# Patient Record
Sex: Male | Born: 1975 | Race: Asian | Hispanic: No | Marital: Single | Smoking: Current every day smoker
Health system: Southern US, Community
[De-identification: ages and names within clinical notes are randomized; demographics above are authoritative.]

## PROBLEM LIST (undated history)

## (undated) DIAGNOSIS — S92309A Fracture of unspecified metatarsal bone(s), unspecified foot, initial encounter for closed fracture: Secondary | ICD-10-CM

## (undated) HISTORY — PX: NO PAST SURGERIES: SHX2092

---

## 2012-12-12 ENCOUNTER — Emergency Department (HOSPITAL_COMMUNITY)
Admission: EM | Admit: 2012-12-12 | Discharge: 2012-12-12 | Disposition: A | Discharge status: Home / Self Care | Payer: Self-pay | Attending: Emergency Medicine | Admitting: Emergency Medicine

## 2012-12-12 ENCOUNTER — Encounter (HOSPITAL_COMMUNITY): Payer: Self-pay | Admitting: Emergency Medicine

## 2012-12-12 DIAGNOSIS — H6120 Impacted cerumen, unspecified ear: Secondary | ICD-10-CM | POA: Insufficient documentation

## 2012-12-12 DIAGNOSIS — R0982 Postnasal drip: Secondary | ICD-10-CM | POA: Insufficient documentation

## 2012-12-12 DIAGNOSIS — H9209 Otalgia, unspecified ear: Secondary | ICD-10-CM | POA: Insufficient documentation

## 2012-12-12 DIAGNOSIS — R05 Cough: Secondary | ICD-10-CM | POA: Insufficient documentation

## 2012-12-12 DIAGNOSIS — R059 Cough, unspecified: Secondary | ICD-10-CM | POA: Insufficient documentation

## 2012-12-12 DIAGNOSIS — H9203 Otalgia, bilateral: Secondary | ICD-10-CM

## 2012-12-12 DIAGNOSIS — J069 Acute upper respiratory infection, unspecified: Secondary | ICD-10-CM | POA: Insufficient documentation

## 2012-12-12 DIAGNOSIS — J029 Acute pharyngitis, unspecified: Secondary | ICD-10-CM | POA: Insufficient documentation

## 2012-12-12 DIAGNOSIS — F17200 Nicotine dependence, unspecified, uncomplicated: Secondary | ICD-10-CM | POA: Insufficient documentation

## 2012-12-12 DIAGNOSIS — J3489 Other specified disorders of nose and nasal sinuses: Secondary | ICD-10-CM | POA: Insufficient documentation

## 2012-12-12 MED ORDER — IBUPROFEN 600 MG PO TABS: 600.0000 mg | ORAL_TABLET | Freq: Four times a day (QID) | ORAL | Status: DC | PRN

## 2012-12-12 MED ORDER — IBUPROFEN 800 MG PO TABS
800.0000 mg | ORAL_TABLET | Freq: Once | ORAL | Status: AC
  Administered 2012-12-12: 800 mg via ORAL
  Filled 2012-12-12: qty 1

## 2012-12-12 MED ORDER — AZITHROMYCIN 250 MG PO TABS: ORAL_TABLET | ORAL | Status: DC

## 2012-12-12 NOTE — ED Notes (Signed)
Per patient, woke up yesterday with left ear pain-got worse today

## 2012-12-12 NOTE — ED Provider Notes (Signed)
History     CSN: [id number]  Arrival date & time 12/12/12  1153   First MD Initiated Contact with Patient 12/12/12 1348      Chief Complaint  Patient presents with  . Otalgia    (Consider location/radiation/quality/duration/timing/severity/associated sxs/prior treatment) Patient is a 37 y.o. male presenting with ear pain. The history is provided by the patient and a parent. No language interpreter was used.  Otalgia Location:  Bilateral (L >R) Severity:  Moderate Onset quality:  Gradual Duration:  2 days Timing:  Constant Progression:  Unchanged Chronicity:  New Worsened by:  Coughing Associated symptoms: congestion, cough, rhinorrhea and sore throat   Associated symptoms: no ear discharge, no fever, no headaches, no neck pain and no vomiting   37 yo with c/o bilateral ear pain x 2 days with upper respiratory symptoms.  Denies fever, nausea and vomiting.  No past medical history. Smoker.  History reviewed. No pertinent past medical history.  History reviewed. No pertinent past surgical history.  No family history on file.  History  Substance Use Topics  . Smoking status: Current Every Day Smoker  . Smokeless tobacco: Not on file  . Alcohol Use: Yes      Review of Systems  Constitutional: Negative.  Negative for fever.  HENT: Positive for ear pain, congestion, sore throat, rhinorrhea and postnasal drip. Negative for neck pain and ear discharge.   Eyes: Negative.   Respiratory: Positive for cough.   Cardiovascular: Negative.   Gastrointestinal: Negative.  Negative for vomiting.  Neurological: Negative.  Negative for headaches.  Psychiatric/Behavioral: Negative.   All other systems reviewed and are negative.    Allergies  Review of patient's allergies indicates no known allergies.  Home Medications  No current outpatient prescriptions on file.  BP 154/103  Pulse 98  Temp(Src) 98.4 F (36.9 C) (Oral)  Resp 18  SpO2 100%  Physical Exam  Nursing note  and vitals reviewed. Constitutional: He is oriented to person, place, and time. He appears well-developed and well-nourished.  HENT:  Head: Normocephalic.  Eyes: Conjunctivae and EOM are normal. Pupils are equal, round, and reactive to light.  Neck: Normal range of motion. Neck supple.  Cardiovascular: Normal rate.   Pulmonary/Chest: Effort normal and breath sounds normal. No respiratory distress.  Abdominal: Soft. He exhibits no distension. There is no tenderness.  Musculoskeletal: Normal range of motion.  Neurological: He is alert and oriented to person, place, and time.  Skin: Skin is warm and dry.  Psychiatric: He has a normal mood and affect.    ED Course  Procedures (including critical care time)  Labs Reviewed - No data to display No results found.   No diagnosis found.    MDM  Upper respiratory infection x 1 week with bilateral ear pain and cerumen impaction.  Rx for z-pack.  Hypertensive in the ER.  He will keep a log book and follow up with pcp from list.  No stroke symptoms. Advised to use products to loosen cerumen.        Remi Haggard, NP 12/13/12 1104

## 2012-12-15 NOTE — ED Provider Notes (Signed)
Medical screening examination/treatment/procedure(s) were performed by non-physician practitioner and as supervising physician I was immediately available for consultation/collaboration.  Devaney Segers T Indyah Saulnier, MD 12/15/12 1525 

## 2013-02-16 ENCOUNTER — Encounter (HOSPITAL_COMMUNITY): Payer: Self-pay

## 2013-02-16 ENCOUNTER — Emergency Department (HOSPITAL_COMMUNITY)
Admission: EM | Admit: 2013-02-16 | Discharge: 2013-02-16 | Disposition: A | Discharge status: Home / Self Care | Payer: Self-pay | Attending: Emergency Medicine | Admitting: Emergency Medicine

## 2013-02-16 ENCOUNTER — Emergency Department (HOSPITAL_COMMUNITY): Payer: Self-pay

## 2013-02-16 DIAGNOSIS — H6120 Impacted cerumen, unspecified ear: Secondary | ICD-10-CM | POA: Insufficient documentation

## 2013-02-16 DIAGNOSIS — S0280XA Fracture of other specified skull and facial bones, unspecified side, initial encounter for closed fracture: Secondary | ICD-10-CM | POA: Insufficient documentation

## 2013-02-16 DIAGNOSIS — S0291XA Unspecified fracture of skull, initial encounter for closed fracture: Secondary | ICD-10-CM

## 2013-02-16 DIAGNOSIS — F1010 Alcohol abuse, uncomplicated: Secondary | ICD-10-CM | POA: Insufficient documentation

## 2013-02-16 LAB — URINALYSIS, ROUTINE W REFLEX MICROSCOPIC
Ketones, ur: NEGATIVE mg/dL
Leukocytes, UA: NEGATIVE
Nitrite: NEGATIVE
Protein, ur: NEGATIVE mg/dL
pH: 6 (ref 5.0–8.0)

## 2013-02-16 LAB — COMPREHENSIVE METABOLIC PANEL
Alkaline Phosphatase: 112 U/L (ref 39–117)
BUN: 11 mg/dL (ref 6–23)
GFR calc Af Amer: >90 mL/min (ref >90)
Glucose, Bld: 100 mg/dL — ABNORMAL HIGH (ref 70–99)
Potassium: 3.5 mEq/L (ref 3.5–5.1)
Total Bilirubin: 0.4 mg/dL (ref 0.3–1.2)
Total Protein: 7.7 g/dL (ref 6.0–8.3)

## 2013-02-16 LAB — CBC WITH DIFFERENTIAL/PLATELET
Eosinophils Absolute: 0.7 10*3/uL (ref 0.0–0.7)
Eosinophils Relative: 6 % — ABNORMAL HIGH (ref 0–5)
Lymphs Abs: 4.1 10*3/uL — ABNORMAL HIGH (ref 0.7–4.0)
MCH: 33.7 pg (ref 26.0–34.0)
MCHC: 37.6 g/dL — ABNORMAL HIGH (ref 30.0–36.0)
MCV: 89.8 fL (ref 78.0–100.0)
Monocytes Absolute: 0.6 10*3/uL (ref 0.1–1.0)
Platelets: 257 10*3/uL (ref 150–400)
RBC: 5.37 MIL/uL (ref 4.22–5.81)
RDW: 13.1 % (ref 11.5–15.5)

## 2013-02-16 MED ORDER — OXYCODONE-ACETAMINOPHEN 5-325 MG PO TABS: 1.0000 | ORAL_TABLET | Freq: Once | ORAL | Status: DC

## 2013-02-16 MED ORDER — OXYCODONE-ACETAMINOPHEN 5-325 MG PO TABS: 1.0000 | ORAL_TABLET | ORAL | Status: DC | PRN

## 2013-02-16 MED ORDER — SODIUM CHLORIDE 0.9 % IV SOLN
1000.0000 mL | Freq: Once | INTRAVENOUS | Status: AC
  Administered 2013-02-16: 1000 mL via INTRAVENOUS

## 2013-02-16 MED ORDER — SODIUM CHLORIDE 0.9 % IV SOLN
1000.0000 mL | INTRAVENOUS | Status: DC
  Administered 2013-02-16: 1000 mL via INTRAVENOUS

## 2013-02-16 MED ORDER — ONDANSETRON 4 MG PO TBDP: 8.0000 mg | ORAL_TABLET | Freq: Once | ORAL | Status: DC

## 2013-02-16 MED ORDER — AMOXICILLIN-POT CLAVULANATE 875-125 MG PO TABS: 1.0000 | ORAL_TABLET | Freq: Two times a day (BID) | ORAL | Status: DC

## 2013-02-16 NOTE — ED Notes (Signed)
Returns from CT 
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
## 2013-02-16 NOTE — ED Notes (Signed)
Pt increasingly agitated in bed, attempting to remove C-collar - explained need to leave in place pending CT, pt sts he may remove collar

## 2013-02-16 NOTE — ED Notes (Signed)
Pt IV pulled out when changing positions. Dr. Patria Mane notified. No new orders- do not need to restart.

## 2013-02-16 NOTE — ED Notes (Signed)
Pt continues to attempt to remove C-collar, staff at bedside to explain need for collar and to prevent pt from removing
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
## 2013-02-16 NOTE — ED Notes (Signed)
Patient transported to CT 
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
## 2013-02-16 NOTE — ED Notes (Signed)
Pt sleeping. Chest visibly rising and falling.

## 2013-04-02 ENCOUNTER — Encounter (HOSPITAL_COMMUNITY): Payer: Self-pay | Admitting: Emergency Medicine

## 2015-10-06 ENCOUNTER — Emergency Department (HOSPITAL_COMMUNITY): Payer: Worker's Compensation

## 2015-10-06 ENCOUNTER — Emergency Department (HOSPITAL_COMMUNITY)
Admission: EM | Admit: 2015-10-06 | Discharge: 2015-10-06 | Disposition: A | Discharge status: Home / Self Care | Payer: Worker's Compensation | Attending: Emergency Medicine | Admitting: Emergency Medicine

## 2015-10-06 ENCOUNTER — Encounter (HOSPITAL_COMMUNITY): Payer: Self-pay | Admitting: Emergency Medicine

## 2015-10-06 DIAGNOSIS — S92902A Unspecified fracture of left foot, initial encounter for closed fracture: Secondary | ICD-10-CM | POA: Insufficient documentation

## 2015-10-06 DIAGNOSIS — Z79899 Other long term (current) drug therapy: Secondary | ICD-10-CM | POA: Insufficient documentation

## 2015-10-06 DIAGNOSIS — Y92099 Unspecified place in other non-institutional residence as the place of occurrence of the external cause: Secondary | ICD-10-CM | POA: Insufficient documentation

## 2015-10-06 DIAGNOSIS — W208XXA Other cause of strike by thrown, projected or falling object, initial encounter: Secondary | ICD-10-CM | POA: Insufficient documentation

## 2015-10-06 DIAGNOSIS — Y998 Other external cause status: Secondary | ICD-10-CM | POA: Insufficient documentation

## 2015-10-06 DIAGNOSIS — S99922A Unspecified injury of left foot, initial encounter: Secondary | ICD-10-CM | POA: Diagnosis present

## 2015-10-06 DIAGNOSIS — Y9389 Activity, other specified: Secondary | ICD-10-CM | POA: Diagnosis not present

## 2015-10-06 DIAGNOSIS — Z792 Long term (current) use of antibiotics: Secondary | ICD-10-CM | POA: Insufficient documentation

## 2015-10-06 DIAGNOSIS — S92309A Fracture of unspecified metatarsal bone(s), unspecified foot, initial encounter for closed fracture: Secondary | ICD-10-CM

## 2015-10-06 HISTORY — DX: Fracture of unspecified metatarsal bone(s), unspecified foot, initial encounter for closed fracture: S92.309A

## 2015-10-06 MED ORDER — OXYCODONE-ACETAMINOPHEN 5-325 MG PO TABS
2.0000 | ORAL_TABLET | Freq: Once | ORAL | Status: AC
  Administered 2015-10-06: 2 via ORAL
  Filled 2015-10-06: qty 2

## 2015-10-06 MED ORDER — OXYCODONE-ACETAMINOPHEN 5-325 MG PO TABS: 1.0000 | ORAL_TABLET | ORAL | Status: AC | PRN

## 2015-10-06 NOTE — ED Notes (Addendum)
Applied Large cam walker to pt's left foot, cam walker did not fit. Paged ortho tech in need of XLarge cam walker.

## 2015-10-06 NOTE — ED Notes (Signed)
Cam walker applied by ortho tech.

## 2015-10-06 NOTE — ED Provider Notes (Signed)
CSN: [id number]     Arrival date & time 10/06/15  1406 History  By signing my name below, I, Doreatha Ploeger, attest that this documentation has been prepared under the direction and in the presence of  [employer], PA-C. Electronically Signed: Doreatha Bordley, ED Scribe. 10/06/2015. 3:07 PM.    Chief Complaint  Patient presents with  . Foot Injury   The history is provided by the patient. No language interpreter was used.    HPI Comments: Helmer Dull is a 39 y.o. male who presents to the Emergency Department complaining of moderate left foot pain and swelling s/p crush injury that occurred 2 hours ago around 1340. Pt states a piece of granite fell on his foot when the forklift dropped it. He states pain is worsened with movement, weight bearing and ambulation. Pain is moderate now in the ED. No chronic medical conditions or daily medications. He denies numbness, paresthesia, weakness, ankle pain, knee pain, abdominal pain, additional injuries.   History reviewed. No pertinent past medical history. History reviewed. No pertinent past surgical history. No family history on file. Social History  Substance Use Topics  . Smoking status: Never Smoker   . Smokeless tobacco: Never Used  . Alcohol Use: Yes    Review of Systems A 10 point review of systems was completed and was negative except for pertinent positives and negatives as mentioned in the history of present illness.   Allergies  Review of patient's allergies indicates no known allergies.  Home Medications   Prior to Admission medications   Medication Sig Start Date End Date Taking? Authorizing Provider  amoxicillin-clavulanate (AUGMENTIN) 875-125 MG per tablet Take 1 tablet by mouth every 12 (twelve) hours. 02/16/13   Azalia Bilis, MD  azithromycin (ZITHROMAX Z-PAK) 250 MG tablet Take 2 pills today and one pill every day until gone 12/12/12   Jethro Bastos, NP  ibuprofen (ADVIL,MOTRIN) 600 MG tablet Take 1 tablet (600 mg total) by  mouth every 6 (six) hours as needed for pain. 12/12/12   Jethro Bastos, NP  oxyCODONE-acetaminophen (PERCOCET/ROXICET) 5-325 MG tablet Take 1 tablet by mouth every 4 (four) hours as needed. 10/06/15   Joycie Peek, PA-C   BP 129/85 mmHg  Pulse 73  Temp(Src) 98.2 F (36.8 C) (Oral)  Resp 14  SpO2 100% Physical Exam  Constitutional: He is oriented to person, place, and time. He appears well-developed and well-nourished.  Awake, alert, nontoxic appearance.    HENT:  Head: Normocephalic and atraumatic.  Eyes: Conjunctivae and EOM are normal. Pupils are equal, round, and reactive to light.  Neck: Normal range of motion. Neck supple.  Cardiovascular: Normal rate, regular rhythm and normal heart sounds.  Exam reveals no gallop and no friction rub.   No murmur heard. Heart sounds normal. RRR.   Pulmonary/Chest: Effort normal and breath sounds normal. No respiratory distress. He has no wheezes. He has no rales.  Lungs CTA bilaterally.   Abdominal: Soft. Bowel sounds are normal. He exhibits no distension. There is no tenderness.  Musculoskeletal: Normal range of motion. He exhibits edema and tenderness.  Tenderness diffusely over the mid foot. There is swelling, but no overt tightness. No tenderness at ankle, full range of motion.  Neurological: He is alert and oriented to person, place, and time.  NVI. Brisk cap refill. Sensation intact to light touch.   Skin: Skin is warm and dry.  Psychiatric: He has a normal mood and affect. His behavior is normal.  Nursing note and vitals reviewed.  ED Course  Procedures (including critical care time) DIAGNOSTIC STUDIES: Oxygen Saturation is 100% on RA, normal by my interpretation.    COORDINATION OF CARE: 3:06 PM Discussed treatment plan with pt at bedside and pt agreed to plan. Plan to XR left foot. Will consult ortho and manage pain in the ED.   3:12 PM Discussed case with orthopedist, Dr. Roda ShuttersXu, who recommends a fracture boot, for the pt to  remain non weightbearing and keep the foot elevated.   Imaging Review Dg Foot Complete Left  10/06/2015  CLINICAL DATA:  Marble tile dropped on left foot. Pain and swelling, initial encounter. EXAM: LEFT FOOT - COMPLETE 3+ VIEW COMPARISON:  None. FINDINGS: Transverse fractures of the distal shafts of the second, third and fourth metatarsals are seen. There is approximately one cortex width medial displacement of the distal fracture fragment involving the third metatarsal, and 3 mm lateral displacement of the distal fracture fragment of the fourth metatarsal. A transverse fracture of the fifth metatarsal neck is also seen. Fracture lines do not extend to the metatarsophalangeal joints. Associated marked soft tissue swelling along the forefoot. IMPRESSION: Second through fifth metatarsal fractures. Electronically Signed   By: Leanna BattlesMelinda  Blietz M.D.   On: 10/06/2015 14:55   I have personally reviewed and evaluated these images as part of my medical decision-making.  Meds given in ED:  Medications  oxyCODONE-acetaminophen (PERCOCET/ROXICET) 5-325 MG per tablet 2 tablet (2 tablets Oral Given 10/06/15 1541)    New Prescriptions   No medications on file     Filed Vitals:   10/06/15 1411  BP: 129/85  Pulse: 73  Temp: 98.2 F (36.8 C)  Resp: 14     MDM   Final diagnoses:  Foot fracture, left, closed, initial encounter    Patient X-Ray shows second through fifth metatarsal fractures. Neurovascularly intact with brisk cap refill. Orthopedist consulted, Dr Roda ShuttersXu, who recommended fracture boot, elevation and for the pt to remain non weightbearing.  Pt advised to follow up with orthopedics tomorrow to schedule reevaluation appointment. Patient given fracture boot while in ED, conservative therapy recommended and discussed. Prescription for oxycodone #20 given. Patient will be discharged home & is agreeable with above plan. Returns precautions discussed. Pt appears safe for discharge.  I personally  performed the services described in this documentation, which was scribed in my presence. The recorded information has been reviewed and is accurate.   Joycie PeekBenjamin Takoda [place], PA-C 10/06/15 1551  Alvira MondayErin Schlossman, MD 10/07/15 [phone]

## 2015-10-06 NOTE — ED Notes (Signed)
Pt states about one hour ago they were lifting up a large piece of granite and it fell onto his left foot. Pt has large amount of swelling to left foot. Sensation intact. Pedal pulses +2.

## 2015-10-06 NOTE — Progress Notes (Signed)
Orthopedic Tech Progress Note Patient Details:  Michael Villarreal 02/01/1976 [id number]  Ortho Devices Type of Ortho Device: CAM walker Ortho Device/Splint Interventions: Application   [name] 10/06/2015, 3:57 PM

## 2015-10-06 NOTE — Discharge Instructions (Signed)
You were evaluated in the ED today for your left foot pain. Your found to have 4 broken bones in your foot. It is important for you to follow-up with Dr. Roda ShuttersXu tomorrow in order to schedule a appointment for reevaluation. Take your pain medications as prescribed. Do not take these medications before driving. Return to ED for new or worsening symptoms including numbness or tingling in your foot, increased swelling, cool extremities.

## 2015-10-10 ENCOUNTER — Other Ambulatory Visit (HOSPITAL_BASED_OUTPATIENT_CLINIC_OR_DEPARTMENT_OTHER): Payer: Self-pay | Admitting: Orthopaedic Surgery

## 2015-10-10 ENCOUNTER — Encounter (HOSPITAL_BASED_OUTPATIENT_CLINIC_OR_DEPARTMENT_OTHER): Payer: Self-pay | Admitting: *Deleted

## 2015-10-11 ENCOUNTER — Ambulatory Visit (HOSPITAL_BASED_OUTPATIENT_CLINIC_OR_DEPARTMENT_OTHER): Payer: Worker's Compensation | Admitting: Certified Registered"

## 2015-10-12 ENCOUNTER — Encounter (HOSPITAL_BASED_OUTPATIENT_CLINIC_OR_DEPARTMENT_OTHER): Payer: Self-pay | Admitting: Certified Registered"

## 2015-10-12 ENCOUNTER — Encounter (HOSPITAL_BASED_OUTPATIENT_CLINIC_OR_DEPARTMENT_OTHER)
Admission: RE | Disposition: A | Discharge status: Home / Self Care | Payer: Self-pay | Source: Ambulatory Visit | Attending: Orthopaedic Surgery

## 2015-10-12 ENCOUNTER — Ambulatory Visit (HOSPITAL_BASED_OUTPATIENT_CLINIC_OR_DEPARTMENT_OTHER)
Admission: RE | Admit: 2015-10-12 | Discharge: 2015-10-12 | Disposition: A | Discharge status: Home / Self Care | Payer: Worker's Compensation | Source: Ambulatory Visit | Attending: Orthopaedic Surgery | Admitting: Orthopaedic Surgery

## 2015-10-12 DIAGNOSIS — Z6836 Body mass index (BMI) 36.0-36.9, adult: Secondary | ICD-10-CM | POA: Insufficient documentation

## 2015-10-12 DIAGNOSIS — X58XXXA Exposure to other specified factors, initial encounter: Secondary | ICD-10-CM | POA: Insufficient documentation

## 2015-10-12 DIAGNOSIS — S92332A Displaced fracture of third metatarsal bone, left foot, initial encounter for closed fracture: Secondary | ICD-10-CM | POA: Diagnosis not present

## 2015-10-12 DIAGNOSIS — S92342A Displaced fracture of fourth metatarsal bone, left foot, initial encounter for closed fracture: Secondary | ICD-10-CM | POA: Insufficient documentation

## 2015-10-12 DIAGNOSIS — Z538 Procedure and treatment not carried out for other reasons: Secondary | ICD-10-CM | POA: Insufficient documentation

## 2015-10-12 DIAGNOSIS — S92352A Displaced fracture of fifth metatarsal bone, left foot, initial encounter for closed fracture: Secondary | ICD-10-CM | POA: Insufficient documentation

## 2015-10-12 DIAGNOSIS — F17210 Nicotine dependence, cigarettes, uncomplicated: Secondary | ICD-10-CM | POA: Insufficient documentation

## 2015-10-12 DIAGNOSIS — S92322A Displaced fracture of second metatarsal bone, left foot, initial encounter for closed fracture: Secondary | ICD-10-CM | POA: Insufficient documentation

## 2015-10-12 HISTORY — DX: Fracture of unspecified metatarsal bone(s), unspecified foot, initial encounter for closed fracture: S92.309A

## 2015-10-12 SURGERY — OPEN REDUCTION INTERNAL FIXATION (ORIF) METATARSAL (TOE) FRACTURE
Anesthesia: General | Laterality: Left

## 2015-10-12 MED ORDER — MIDAZOLAM HCL 2 MG/2ML IJ SOLN
INTRAMUSCULAR | Status: AC
  Filled 2015-10-12: qty 2

## 2015-10-12 MED ORDER — CEFAZOLIN SODIUM-DEXTROSE 2-3 GM-% IV SOLR: 2.0000 g | INTRAVENOUS | Status: DC

## 2015-10-12 MED ORDER — CEFAZOLIN SODIUM-DEXTROSE 2-3 GM-% IV SOLR
INTRAVENOUS | Status: AC
  Filled 2015-10-12: qty 50

## 2015-10-12 MED ORDER — FENTANYL CITRATE (PF) 100 MCG/2ML IJ SOLN
INTRAMUSCULAR | Status: AC
Start: 2015-10-12 — End: 2015-10-12
  Filled 2015-10-12: qty 2

## 2015-10-12 MED ORDER — DEXAMETHASONE SODIUM PHOSPHATE 10 MG/ML IJ SOLN
INTRAMUSCULAR | Status: AC
  Filled 2015-10-12: qty 1

## 2015-10-12 MED ORDER — FENTANYL CITRATE (PF) 100 MCG/2ML IJ SOLN
INTRAMUSCULAR | Status: AC
  Filled 2015-10-12: qty 2

## 2015-10-12 MED ORDER — ONDANSETRON HCL 4 MG/2ML IJ SOLN
INTRAMUSCULAR | Status: AC
  Filled 2015-10-12: qty 2

## 2015-10-12 MED ORDER — LIDOCAINE HCL (CARDIAC) 20 MG/ML IV SOLN
INTRAVENOUS | Status: AC
  Filled 2015-10-12: qty 5

## 2015-10-12 MED ORDER — PHENYLEPHRINE 40 MCG/ML (10ML) SYRINGE FOR IV PUSH (FOR BLOOD PRESSURE SUPPORT)
PREFILLED_SYRINGE | INTRAVENOUS | Status: AC
  Filled 2015-10-12: qty 10

## 2015-10-12 NOTE — Progress Notes (Signed)
Patient's swelling is severe with fracture blisters.  Surgery cancelled for today and will postpone to next week.  Local wound care to blister for now.  NWB.  Strict elevation.  I will have my office reschedule him for next week.  Mayra ReelN. Michael Louiza Moor, MD [facility] [phone] 10:42 AM

## 2015-10-12 NOTE — Anesthesia Preprocedure Evaluation (Addendum)
Anesthesia Evaluation  Patient identified by MRN, date of birth, ID band Patient awake    Reviewed: Allergy & Precautions, NPO status , Patient's Chart, lab work & pertinent test results  Airway Mallampati: I  TM Distance: >3 FB Neck ROM: Full    Dental   Pulmonary Current Smoker,    Pulmonary exam normal        Cardiovascular Normal cardiovascular exam     Neuro/Psych    GI/Hepatic   Endo/Other  Morbid obesity  Renal/GU      Musculoskeletal   Abdominal   Peds  Hematology   Anesthesia Other Findings   Reproductive/Obstetrics                            Anesthesia Physical Anesthesia Plan  ASA: II  Anesthesia Plan: Regional   Post-op Pain Management:    Induction: Intravenous  Airway Management Planned: Simple Face Mask  Additional Equipment:   Intra-op Plan:   Post-operative Plan:   Informed Consent: I have reviewed the patients History and Physical, chart, labs and discussed the procedure including the risks, benefits and alternatives for the proposed anesthesia with the patient or authorized representative who has indicated his/her understanding and acceptance.     Plan Discussed with: CRNA and Surgeon  Anesthesia Plan Comments:         Anesthesia Quick Evaluation

## 2015-10-12 NOTE — Progress Notes (Signed)
OR case cancelled per Dr. Roda ShuttersXu d/t blister on operative foot.  Case to be rescheduled.  Pt instructed and verbalizes understanding.

## 2015-10-12 NOTE — H&P (Signed)
    PREOPERATIVE H&P  Chief Complaint: left 2nd, 3rd, 4th, 5th metatarsal fractures  HPI: Michael Villarreal is a 39 y.o. male who presents for surgical treatment of left 2nd, 3rd, 4th, 5th metatarsal fractures.  He denies any changes in medical history.  Past Medical History  Diagnosis Date  . Metatarsal fracture 10/06/2015    left 2nd, 3rd, 4th, 5th   Past Surgical History  Procedure Laterality Date  . No past surgeries     Social History   Social History  . Marital Status: Single    Spouse Name: N/A  . Number of Children: N/A  . Years of Education: N/A   Social History Main Topics  . Smoking status: Current Every Day Smoker -- 0.50 packs/day for 18 years    Types: Cigarettes  . Smokeless tobacco: Current User  . Alcohol Use: Yes     Comment: occasionally  . Drug Use: No  . Sexual Activity: Not Asked   Other Topics Concern  . None   Social History Narrative   ** Merged History Encounter **       History reviewed. No pertinent family history. No Known Allergies Prior to Admission medications   Medication Sig Start Date End Date Taking? Authorizing Provider  oxyCODONE-acetaminophen (PERCOCET/ROXICET) 5-325 MG tablet Take 1 tablet by mouth every 4 (four) hours as needed. 10/06/15  Yes Benjamin Cartner, PA-C     Positive ROS: All other systems have been reviewed and were otherwise negative with the exception of those mentioned in the HPI and as above.  Physical Exam: General: Alert, no acute distress Cardiovascular: No pedal edema Respiratory: No cyanosis, no use of accessory musculature GI: abdomen soft Skin: No lesions in the area of chief complaint Neurologic: Sensation intact distally Psychiatric: Patient is competent for consent with normal mood and affect Lymphatic: no lymphedema  MUSCULOSKELETAL: exam stable  Assessment: left 2nd, 3rd, 4th, 5th metatarsal fractures  Plan: Plan for Procedure(s): OPEN REDUCTION INTERNAL FIXATION (ORIF) LEFT 2ND, 3RD,  4TH, 5TH, METATARSAL FRACTURES  The risks benefits and alternatives were discussed with the patient including but not limited to the risks of nonoperative treatment, versus surgical intervention including infection, bleeding, nerve injury,  blood clots, cardiopulmonary complications, morbidity, mortality, among others, and they were willing to proceed.   Cheral AlmasXu, Naiping Michael, MD   10/12/2015 7:16 AM

## 2015-10-14 ENCOUNTER — Encounter (HOSPITAL_BASED_OUTPATIENT_CLINIC_OR_DEPARTMENT_OTHER): Payer: Self-pay | Admitting: *Deleted

## 2015-10-17 ENCOUNTER — Other Ambulatory Visit (HOSPITAL_BASED_OUTPATIENT_CLINIC_OR_DEPARTMENT_OTHER): Payer: Self-pay | Admitting: Orthopaedic Surgery

## 2015-10-19 ENCOUNTER — Ambulatory Visit (HOSPITAL_BASED_OUTPATIENT_CLINIC_OR_DEPARTMENT_OTHER): Payer: Worker's Compensation | Admitting: Anesthesiology

## 2015-10-19 ENCOUNTER — Ambulatory Visit (HOSPITAL_BASED_OUTPATIENT_CLINIC_OR_DEPARTMENT_OTHER)
Admission: RE | Admit: 2015-10-19 | Discharge: 2015-10-19 | Disposition: A | Discharge status: Home / Self Care | Payer: Worker's Compensation | Source: Ambulatory Visit | Attending: Orthopaedic Surgery | Admitting: Orthopaedic Surgery

## 2015-10-19 ENCOUNTER — Encounter (HOSPITAL_BASED_OUTPATIENT_CLINIC_OR_DEPARTMENT_OTHER)
Admission: RE | Disposition: A | Discharge status: Home / Self Care | Payer: Self-pay | Source: Ambulatory Visit | Attending: Orthopaedic Surgery

## 2015-10-19 ENCOUNTER — Encounter (HOSPITAL_BASED_OUTPATIENT_CLINIC_OR_DEPARTMENT_OTHER): Payer: Self-pay | Admitting: Anesthesiology

## 2015-10-19 DIAGNOSIS — X58XXXA Exposure to other specified factors, initial encounter: Secondary | ICD-10-CM | POA: Diagnosis not present

## 2015-10-19 DIAGNOSIS — Z6836 Body mass index (BMI) 36.0-36.9, adult: Secondary | ICD-10-CM | POA: Insufficient documentation

## 2015-10-19 DIAGNOSIS — S92332A Displaced fracture of third metatarsal bone, left foot, initial encounter for closed fracture: Secondary | ICD-10-CM | POA: Insufficient documentation

## 2015-10-19 DIAGNOSIS — F17210 Nicotine dependence, cigarettes, uncomplicated: Secondary | ICD-10-CM | POA: Insufficient documentation

## 2015-10-19 DIAGNOSIS — S92352A Displaced fracture of fifth metatarsal bone, left foot, initial encounter for closed fracture: Secondary | ICD-10-CM | POA: Insufficient documentation

## 2015-10-19 DIAGNOSIS — S92342A Displaced fracture of fourth metatarsal bone, left foot, initial encounter for closed fracture: Secondary | ICD-10-CM | POA: Diagnosis not present

## 2015-10-19 DIAGNOSIS — S92322A Displaced fracture of second metatarsal bone, left foot, initial encounter for closed fracture: Secondary | ICD-10-CM | POA: Insufficient documentation

## 2015-10-19 HISTORY — PX: ORIF TOE FRACTURE: SHX5032

## 2015-10-19 SURGERY — OPEN REDUCTION INTERNAL FIXATION (ORIF) METATARSAL (TOE) FRACTURE
Anesthesia: General | Site: Foot | Laterality: Left

## 2015-10-19 MED ORDER — OXYCODONE HCL 5 MG/5ML PO SOLN: 5.0000 mg | Freq: Once | ORAL | Status: AC | PRN

## 2015-10-19 MED ORDER — HYDROMORPHONE HCL 1 MG/ML IJ SOLN
INTRAMUSCULAR | Status: AC
  Filled 2015-10-19: qty 1

## 2015-10-19 MED ORDER — LIDOCAINE HCL (CARDIAC) 20 MG/ML IV SOLN
INTRAVENOUS | Status: AC
  Filled 2015-10-19: qty 5

## 2015-10-19 MED ORDER — HYDROMORPHONE HCL 1 MG/ML IJ SOLN
0.2500 mg | INTRAMUSCULAR | Status: DC | PRN
  Administered 2015-10-19 (×5): 0.5 mg via INTRAVENOUS

## 2015-10-19 MED ORDER — LIDOCAINE HCL (CARDIAC) 20 MG/ML IV SOLN
INTRAVENOUS | Status: DC | PRN
  Administered 2015-10-19: 50 mg via INTRAVENOUS

## 2015-10-19 MED ORDER — CEFAZOLIN SODIUM-DEXTROSE 2-3 GM-% IV SOLR
2.0000 g | INTRAVENOUS | Status: AC
  Administered 2015-10-19: 2 g via INTRAVENOUS

## 2015-10-19 MED ORDER — BUPIVACAINE HCL (PF) 0.5 % IJ SOLN
INTRAMUSCULAR | Status: AC
  Filled 2015-10-19: qty 30

## 2015-10-19 MED ORDER — SCOPOLAMINE 1 MG/3DAYS TD PT72: 1.0000 | MEDICATED_PATCH | Freq: Once | TRANSDERMAL | Status: DC | PRN

## 2015-10-19 MED ORDER — OXYCODONE HCL 5 MG PO TABS
ORAL_TABLET | ORAL | Status: AC
  Filled 2015-10-19: qty 1

## 2015-10-19 MED ORDER — MEPERIDINE HCL 25 MG/ML IJ SOLN: 6.2500 mg | INTRAMUSCULAR | Status: DC | PRN

## 2015-10-19 MED ORDER — FENTANYL CITRATE (PF) 100 MCG/2ML IJ SOLN
INTRAMUSCULAR | Status: AC
  Filled 2015-10-19: qty 2

## 2015-10-19 MED ORDER — MIDAZOLAM HCL 5 MG/5ML IJ SOLN
INTRAMUSCULAR | Status: DC | PRN
  Administered 2015-10-19: 2 mg via INTRAVENOUS

## 2015-10-19 MED ORDER — KETOROLAC TROMETHAMINE 30 MG/ML IJ SOLN
INTRAMUSCULAR | Status: AC
  Filled 2015-10-19: qty 1

## 2015-10-19 MED ORDER — ONDANSETRON HCL 4 MG/2ML IJ SOLN: 4.0000 mg | Freq: Once | INTRAMUSCULAR | Status: DC | PRN

## 2015-10-19 MED ORDER — PROPOFOL 10 MG/ML IV BOLUS
INTRAVENOUS | Status: AC
  Filled 2015-10-19: qty 40

## 2015-10-19 MED ORDER — FENTANYL CITRATE (PF) 100 MCG/2ML IJ SOLN
INTRAMUSCULAR | Status: DC | PRN
  Administered 2015-10-19: 50 ug via INTRAVENOUS
  Administered 2015-10-19: 100 ug via INTRAVENOUS
  Administered 2015-10-19: 50 ug via INTRAVENOUS

## 2015-10-19 MED ORDER — DEXAMETHASONE SODIUM PHOSPHATE 10 MG/ML IJ SOLN
INTRAMUSCULAR | Status: AC
  Filled 2015-10-19: qty 1

## 2015-10-19 MED ORDER — KETOROLAC TROMETHAMINE 30 MG/ML IJ SOLN
INTRAMUSCULAR | Status: DC | PRN
  Administered 2015-10-19: 30 mg via INTRAVENOUS

## 2015-10-19 MED ORDER — MIDAZOLAM HCL 2 MG/2ML IJ SOLN: 1.0000 mg | INTRAMUSCULAR | Status: DC | PRN

## 2015-10-19 MED ORDER — ONDANSETRON HCL 4 MG/2ML IJ SOLN
INTRAMUSCULAR | Status: AC
  Filled 2015-10-19: qty 2

## 2015-10-19 MED ORDER — OXYCODONE HCL 5 MG PO TABS
5.0000 mg | ORAL_TABLET | Freq: Once | ORAL | Status: AC | PRN
  Administered 2015-10-19: 5 mg via ORAL

## 2015-10-19 MED ORDER — HYDROMORPHONE HCL 1 MG/ML IJ SOLN: 0.2500 mg | INTRAMUSCULAR | Status: DC | PRN

## 2015-10-19 MED ORDER — SENNOSIDES-DOCUSATE SODIUM 8.6-50 MG PO TABS: 1.0000 | ORAL_TABLET | Freq: Every evening | ORAL | Status: AC | PRN

## 2015-10-19 MED ORDER — MIDAZOLAM HCL 2 MG/2ML IJ SOLN
INTRAMUSCULAR | Status: AC
  Filled 2015-10-19: qty 2

## 2015-10-19 MED ORDER — LACTATED RINGERS IV SOLN
INTRAVENOUS | Status: DC
  Administered 2015-10-19: 09:00:00 via INTRAVENOUS
  Administered 2015-10-19: 10 mL/h via INTRAVENOUS
  Administered 2015-10-19: 10:00:00 via INTRAVENOUS

## 2015-10-19 MED ORDER — FENTANYL CITRATE (PF) 100 MCG/2ML IJ SOLN: 50.0000 ug | INTRAMUSCULAR | Status: DC | PRN

## 2015-10-19 MED ORDER — PROPOFOL 10 MG/ML IV BOLUS
INTRAVENOUS | Status: DC | PRN
  Administered 2015-10-19: 100 mg via INTRAVENOUS
  Administered 2015-10-19: 200 mg via INTRAVENOUS

## 2015-10-19 MED ORDER — NALOXONE HCL 0.4 MG/ML IJ SOLN
INTRAMUSCULAR | Status: AC
  Filled 2015-10-19: qty 1

## 2015-10-19 MED ORDER — CEFAZOLIN SODIUM-DEXTROSE 2-3 GM-% IV SOLR
INTRAVENOUS | Status: AC
  Filled 2015-10-19: qty 50

## 2015-10-19 MED ORDER — BUPIVACAINE HCL (PF) 0.25 % IJ SOLN
INTRAMUSCULAR | Status: AC
  Filled 2015-10-19: qty 30

## 2015-10-19 MED ORDER — HYDROCODONE-ACETAMINOPHEN 5-325 MG PO TABS: 1.0000 | ORAL_TABLET | Freq: Four times a day (QID) | ORAL | Status: AC | PRN

## 2015-10-19 MED ORDER — DEXAMETHASONE SODIUM PHOSPHATE 4 MG/ML IJ SOLN
INTRAMUSCULAR | Status: DC | PRN
Start: 2015-10-19 — End: 2015-10-19
  Administered 2015-10-19: 10 mg via INTRAVENOUS

## 2015-10-19 MED ORDER — GLYCOPYRROLATE 0.2 MG/ML IJ SOLN: 0.2000 mg | Freq: Once | INTRAMUSCULAR | Status: DC | PRN

## 2015-10-19 SURGICAL SUPPLY — 66 items
BANDAGE ACE 4X5 VEL STRL LF (GAUZE/BANDAGES/DRESSINGS) ×3 IMPLANT
BANDAGE ELASTIC 6 VELCRO ST LF (GAUZE/BANDAGES/DRESSINGS) ×3 IMPLANT
BANDAGE ESMARK 6X9 LF (GAUZE/BANDAGES/DRESSINGS) ×1 IMPLANT
BLADE HEX COATED 2.75 (ELECTRODE) IMPLANT
BLADE SURG 15 STRL LF DISP TIS (BLADE) ×1 IMPLANT
BLADE SURG 15 STRL SS (BLADE) ×2
BNDG ESMARK 6X9 LF (GAUZE/BANDAGES/DRESSINGS) ×3
CANISTER SUCT 1200ML W/VALVE (MISCELLANEOUS) IMPLANT
COVER BACK TABLE 60X90IN (DRAPES) ×3 IMPLANT
CUFF TOURNIQUET SINGLE 34IN LL (TOURNIQUET CUFF) IMPLANT
DECANTER SPIKE VIAL GLASS SM (MISCELLANEOUS) IMPLANT
DRAPE C-ARM 42X72 X-RAY (DRAPES) IMPLANT
DRAPE C-ARMOR (DRAPES) IMPLANT
DRAPE EXTREMITY T 121X128X90 (DRAPE) ×3 IMPLANT
DRAPE OEC MINIVIEW 54X84 (DRAPES) ×3 IMPLANT
DRAPE SURG 17X23 STRL (DRAPES) ×3 IMPLANT
DRAPE U 20/CS (DRAPES) ×3 IMPLANT
DRSG PAD ABDOMINAL 8X10 ST (GAUZE/BANDAGES/DRESSINGS) ×3 IMPLANT
DURAPREP 26ML APPLICATOR (WOUND CARE) ×3 IMPLANT
ELECT REM PT RETURN 9FT ADLT (ELECTROSURGICAL)
ELECTRODE REM PT RTRN 9FT ADLT (ELECTROSURGICAL) IMPLANT
GAUZE SPONGE 4X4 12PLY STRL (GAUZE/BANDAGES/DRESSINGS) ×3 IMPLANT
GAUZE XEROFORM 1X8 LF (GAUZE/BANDAGES/DRESSINGS) ×3 IMPLANT
GAUZE XEROFORM 5X9 LF (GAUZE/BANDAGES/DRESSINGS) ×3 IMPLANT
GLOVE BIOGEL PI IND STRL 7.0 (GLOVE) ×2 IMPLANT
GLOVE BIOGEL PI INDICATOR 7.0 (GLOVE) ×4
GLOVE ECLIPSE 6.5 STRL STRAW (GLOVE) ×3 IMPLANT
GLOVE NEODERM STRL 7.5 LF PF (GLOVE) ×1 IMPLANT
GLOVE SURG NEODERM 7.5  LF PF (GLOVE) ×2
GLOVE SURG SYN 7.5  E (GLOVE) ×2
GLOVE SURG SYN 7.5 E (GLOVE) ×1 IMPLANT
GOWN STRL REIN XL XLG (GOWN DISPOSABLE) ×3 IMPLANT
GOWN STRL REUS W/ TWL LRG LVL3 (GOWN DISPOSABLE) ×1 IMPLANT
GOWN STRL REUS W/TWL LRG LVL3 (GOWN DISPOSABLE) ×2
K-WIRE 9  SMOOTH .062 (WIRE) ×12 IMPLANT
NEEDLE HYPO 22GX1.5 SAFETY (NEEDLE) IMPLANT
NS IRRIG 1000ML POUR BTL (IV SOLUTION) ×3 IMPLANT
PACK BASIN DAY SURGERY FS (CUSTOM PROCEDURE TRAY) ×3 IMPLANT
PAD CAST 3X4 CTTN HI CHSV (CAST SUPPLIES) IMPLANT
PAD CAST 4YDX4 CTTN HI CHSV (CAST SUPPLIES) ×1 IMPLANT
PADDING CAST COTTON 3X4 STRL (CAST SUPPLIES)
PADDING CAST COTTON 4X4 STRL (CAST SUPPLIES) ×2
PADDING CAST COTTON 6X4 STRL (CAST SUPPLIES) ×3 IMPLANT
PADDING CAST SYN 6 (CAST SUPPLIES)
PADDING CAST SYNTHETIC 4 (CAST SUPPLIES)
PADDING CAST SYNTHETIC 4X4 STR (CAST SUPPLIES) IMPLANT
PADDING CAST SYNTHETIC 6X4 NS (CAST SUPPLIES) IMPLANT
PENCIL BUTTON HOLSTER BLD 10FT (ELECTRODE) IMPLANT
SHEET MEDIUM DRAPE 40X70 STRL (DRAPES) IMPLANT
SLEEVE SCD COMPRESS KNEE MED (MISCELLANEOUS) ×3 IMPLANT
SPLINT FIBERGLASS 3X35 (CAST SUPPLIES) IMPLANT
SPLINT FIBERGLASS 4X30 (CAST SUPPLIES) IMPLANT
SPONGE LAP 4X18 X RAY DECT (DISPOSABLE) ×3 IMPLANT
SUCTION FRAZIER TIP 10 FR DISP (SUCTIONS) IMPLANT
SUT ETHILON 3 0 PS 1 (SUTURE) IMPLANT
SUT VIC AB 0 CT1 27 (SUTURE)
SUT VIC AB 0 CT1 27XBRD ANBCTR (SUTURE) IMPLANT
SUT VIC AB 2-0 CT1 27 (SUTURE)
SUT VIC AB 2-0 CT1 TAPERPNT 27 (SUTURE) IMPLANT
SYR BULB 3OZ (MISCELLANEOUS) ×3 IMPLANT
SYR CONTROL 10ML LL (SYRINGE) IMPLANT
TOWEL OR 17X24 6PK STRL BLUE (TOWEL DISPOSABLE) ×6 IMPLANT
TUBE CONNECTING 20'X1/4 (TUBING)
TUBE CONNECTING 20X1/4 (TUBING) IMPLANT
UNDERPAD 30X30 (UNDERPADS AND DIAPERS) ×3 IMPLANT
YANKAUER SUCT BULB TIP NO VENT (SUCTIONS) IMPLANT

## 2015-10-19 NOTE — Anesthesia Postprocedure Evaluation (Signed)
Anesthesia Post Note  Patient: Michael JewettMarcus Villarreal  Procedure(s) Performed: Procedure(s) (LRB): OPEN REDUCTION INTERNAL FIXATION (ORIF) LEFT 2ND, 3RD, 4TH, 5TH METATARSAL FRACTURES (Left)  Patient location during evaluation: PACU Anesthesia Type: General Level of consciousness: awake and alert Pain management: pain level controlled Vital Signs Assessment: post-procedure vital signs reviewed and stable Respiratory status: spontaneous breathing, nonlabored ventilation and respiratory function stable Cardiovascular status: blood pressure returned to baseline and stable Postop Assessment: no signs of nausea or vomiting Anesthetic complications: no    Last Vitals:  Filed Vitals:   10/19/15 1030 10/19/15 1120  BP: 145/90 144/96  Pulse: 87 86  Temp:  36.7 C  Resp: 19 16    Last Pain:  Filed Vitals:   10/19/15 1121  PainSc: 2                  Evan Mackie A

## 2015-10-19 NOTE — Anesthesia Procedure Notes (Signed)
Procedure Name: LMA Insertion Date/Time: 10/19/2015 8:48 AM Performed by: Genevieve NorlanderLINKA, Jenya Putz L Pre-anesthesia Checklist: Patient identified, Emergency Drugs available, Suction available, Patient being monitored and Timeout performed Patient Re-evaluated:Patient Re-evaluated prior to inductionOxygen Delivery Method: Circle System Utilized Preoxygenation: Pre-oxygenation with 100% oxygen Intubation Type: IV induction Ventilation: Mask ventilation without difficulty LMA: LMA inserted LMA Size: 5.0 Number of attempts: 1 Airway Equipment and Method: Bite block Placement Confirmation: positive ETCO2 Tube secured with: Tape Dental Injury: Teeth and Oropharynx as per pre-operative assessment

## 2015-10-19 NOTE — Op Note (Addendum)
   Date of Surgery: 10/19/2015  INDICATIONS: Mr. Michael Villarreal is a 39 y.o.-year-old male with a left 2nd-5th metatarsal fractures;  The patient did consent to the procedure after discussion of the risks and benefits.  PREOPERATIVE DIAGNOSIS: Left 2nd-5th metatarsal fractures  POSTOPERATIVE DIAGNOSIS: Same.  PROCEDURE: Percutaneous fixation of left 2nd, 4th, 5th metatarsal fractures  SURGEON: N. Glee ArvinMichael Xu, M.D.  ASSIST: none.  ANESTHESIA:  general  IV FLUIDS AND URINE: See anesthesia.  ESTIMATED BLOOD LOSS: minimal mL.  IMPLANTS: 0.062 K wires  DRAINS: none  COMPLICATIONS: None.  DESCRIPTION OF PROCEDURE: The patient was brought to the operating room and placed supine on the operating table.  The patient had been signed prior to the procedure and this was documented. The patient had the anesthesia placed by the anesthesiologist.  A time-out was performed to confirm that this was the correct patient, site, side and location. The patient did receive antibiotics prior to the incision and was re-dosed during the procedure as needed at indicated intervals.  A tourniquet placed.  The patient had the operative extremity prepped and draped in the standard surgical fashion.    Using fluoroscopy, each metatarsal was treated with a K wire advanced down the shaft of the metatarsal.  The 3rd metatarsal could not be reduced with closed means and given the location of the fracture blister, I could not make a dorsal incision.  With all of the adjacent metatarsals reduced and stabilized, I felt leaving the 3rd metatarsal with a pin was acceptable.   Fluoroscopy confirmed fracture reduction of the other metatarsals.   The pins were cut short and bent.  Sterile dressings were applied.  Patient tolerated the procedure well.  POSTOPERATIVE PLAN: Non weight bearing to the left foot.    Mayra ReelN. Michael Xu, MD [facility] [phone] 8:26 AM

## 2015-10-19 NOTE — Anesthesia Preprocedure Evaluation (Signed)
Anesthesia Evaluation  Patient identified by MRN, date of birth, ID band Patient awake    Reviewed: Allergy & Precautions, NPO status , Patient's Chart, lab work & pertinent test results  Airway Mallampati: II  TM Distance: >3 FB Neck ROM: Full    Dental  (+) Teeth Intact, Dental Advisory Given   Pulmonary Current Smoker,    breath sounds clear to auscultation       Cardiovascular  Rhythm:Regular Rate:Normal     Neuro/Psych    GI/Hepatic   Endo/Other  Morbid obesity  Renal/GU      Musculoskeletal   Abdominal   Peds  Hematology   Anesthesia Other Findings   Reproductive/Obstetrics                             Anesthesia Physical Anesthesia Plan  ASA: II  Anesthesia Plan: General   Post-op Pain Management:    Induction: Intravenous  Airway Management Planned: LMA  Additional Equipment:   Intra-op Plan:   Post-operative Plan: Extubation in OR  Informed Consent: I have reviewed the patients History and Physical, chart, labs and discussed the procedure including the risks, benefits and alternatives for the proposed anesthesia with the patient or authorized representative who has indicated his/her understanding and acceptance.   Dental advisory given  Plan Discussed with: CRNA, Surgeon and Anesthesiologist  Anesthesia Plan Comments:         Anesthesia Quick Evaluation

## 2015-10-19 NOTE — Transfer of Care (Signed)
Immediate Anesthesia Transfer of Care Note  Patient: Michael Villarreal  Procedure(s) Performed: Procedure(s): OPEN REDUCTION INTERNAL FIXATION (ORIF) LEFT 2ND, 3RD, 4TH, 5TH METATARSAL FRACTURES (Left)  Patient Location: PACU  Anesthesia Type:General  Level of Consciousness: awake and patient cooperative  Airway & Oxygen Therapy: Patient Spontanous Breathing and Patient connected to face mask oxygen  Post-op Assessment: Report given to RN and Post -op Vital signs reviewed and stable  Post vital signs: Reviewed and stable  Last Vitals:  Filed Vitals:   10/19/15 0824  BP: 125/77  Pulse: 83  Temp: 36.7 C  Resp: 16    Complications: No apparent anesthesia complications

## 2015-10-19 NOTE — H&P (Signed)
    PREOPERATIVE H&P  Chief Complaint: left 2nd,3rd,4th,5th metatarsal fractures  HPI: Michael Villarreal Michael Villarreal is a 39 y.o. male who presents for surgical treatment of left 2nd,3rd,4th,5th metatarsal fractures.  He denies any changes in medical history.  Past Medical History  Diagnosis Date  . Metatarsal fracture 10/06/2015    left 2nd, 3rd, 4th, 5th   Past Surgical History  Procedure Laterality Date  . No past surgeries     Social History   Social History  . Marital Status: Single    Spouse Name: N/A  . Number of Children: N/A  . Years of Education: N/A   Social History Main Topics  . Smoking status: Current Every Day Smoker -- 0.50 packs/day for 18 years    Types: Cigarettes  . Smokeless tobacco: Current User  . Alcohol Use: Yes     Comment: occasionally  . Drug Use: No  . Sexual Activity: Not Asked   Other Topics Concern  . None   Social History Narrative   ** Merged History Encounter **       History reviewed. No pertinent family history. No Known Allergies Prior to Admission medications   Medication Sig Start Date End Date Taking? Authorizing Provider  oxyCODONE-acetaminophen (PERCOCET/ROXICET) 5-325 MG tablet Take 1 tablet by mouth every 4 (four) hours as needed. 10/06/15  Yes Benjamin Cartner, PA-C     Positive ROS: All other systems have been reviewed and were otherwise negative with the exception of those mentioned in the HPI and as above.  Physical Exam: General: Alert, no acute distress Cardiovascular: No pedal edema Respiratory: No cyanosis, no use of accessory musculature GI: abdomen soft Skin: No lesions in the area of chief complaint Neurologic: Sensation intact distally Psychiatric: Patient is competent for consent with normal mood and affect Lymphatic: no lymphedema  MUSCULOSKELETAL:  - dorsal fracture blister intact - swelling is resolved - foot wwp  Assessment: left 2nd,3rd,4th,5th metatarsal fractures  Plan: Plan for Procedure(s): OPEN  REDUCTION INTERNAL FIXATION (ORIF) LEFT 2ND, 3RD, 4TH, 5TH METATARSAL FRACTURES  The risks benefits and alternatives were discussed with the patient including but not limited to the risks of nonoperative treatment, versus surgical intervention including infection, bleeding, nerve injury,  blood clots, cardiopulmonary complications, morbidity, mortality, among others, and they were willing to proceed.   Cheral AlmasXu, Wanell Lorenzi Michael, MD   10/19/2015 8:11 AM

## 2015-10-19 NOTE — Discharge Instructions (Signed)
Postoperative instructions: ° °Weightbearing: non weight bearing ° °Dressing instructions: Keep your dressing and/or splint clean and dry at all times.  It will be removed at your first post-operative appointment.  Your stitches and/or staples will be removed at this visit. ° °Incision instructions:  Do not soak your incision for 3 weeks after surgery.  If the incision gets wet, pat dry and do not scrub the incision. ° °Pain control:  You have been given a prescription to be taken as directed for post-operative pain control.  In addition, elevate the operative extremity above the heart at all times to prevent swelling and throbbing pain. ° °Take over-the-counter Colace, 100mg by mouth twice a day while taking narcotic pain medications to help prevent constipation. ° °Follow up appointments: °1) 10-14 days for suture removal and wound check. °2) Dr. Xu as scheduled. ° ° ------------------------------------------------------------------------------------------------------------- ° °After Surgery Pain Control: ° °After your surgery, post-surgical discomfort or pain is likely. This discomfort can last several days to a few weeks. At certain times of the day your discomfort may be more intense.  °Did you receive a nerve block?  °A nerve block can provide pain relief for one hour to two days after your surgery. As long as the nerve block is working, you will experience little or no sensation in the area the surgeon operated on.  °As the nerve block wears off, you will begin to experience pain or discomfort. It is very important that you begin taking your prescribed pain medication before the nerve block fully wears off. Treating your pain at the first sign of the block wearing off will ensure your pain is better controlled and more tolerable when full-sensation returns. Do not wait until the pain is intolerable, as the medicine will be less effective. It is better to treat pain in advance than to try and catch up.    °General Anesthesia:  °If you did not receive a nerve block during your surgery, you will need to start taking your pain medication shortly after your surgery and should continue to do so as prescribed by your surgeon.  °Pain Medication:  °Most commonly we prescribe Vicodin and Percocet for post-operative pain. Both of these medications contain a combination of acetaminophen (Tylenol®) and a narcotic to help control pain.  °· It takes between 30 and 45 minutes before pain medication starts to work. It is important to take your medication before your pain level gets too intense.  °· Nausea is a common side effect of many pain medications. You will want to eat something before taking your pain medicine to help prevent nausea.  °· If you are taking a prescription pain medication that contains acetaminophen, we recommend that you do not take additional over the counter acetaminophen (Tylenol®).  °Other pain relieving options:  °· Using a cold pack to ice the affected area a few times a day (15 to 20 minutes at a time) can help to relieve pain, reduce swelling and bruising.  °· Elevation of the affected area can also help to reduce pain and swelling. ° ° ° ° °Post Anesthesia Home Care Instructions ° °Activity: °Get plenty of rest for the remainder of the day. A responsible adult should stay with you for 24 hours following the procedure.  °For the next 24 hours, DO NOT: °-Drive a car °-Operate machinery °-Drink alcoholic beverages °-Take any medication unless instructed by your physician °-Make any legal decisions or sign important papers. ° °Meals: °Start with liquid foods such as gelatin   or soup. Progress to regular foods as tolerated. Avoid greasy, spicy, heavy foods. If nausea and/or vomiting occur, drink only clear liquids until the nausea and/or vomiting subsides. Call your physician if vomiting continues. ° °Special Instructions/Symptoms: °Your throat may feel dry or sore from the anesthesia or the breathing tube  placed in your throat during surgery. If this causes discomfort, gargle with warm salt water. The discomfort should disappear within 24 hours. ° °If you had a scopolamine patch placed behind your ear for the management of post- operative nausea and/or vomiting: ° °1. The medication in the patch is effective for 72 hours, after which it should be removed.  Wrap patch in a tissue and discard in the trash. Wash hands thoroughly with soap and water. °2. You may remove the patch earlier than 72 hours if you experience unpleasant side effects which may include dry mouth, dizziness or visual disturbances. °3. Avoid touching the patch. Wash your hands with soap and water after contact with the patch. °  ° °

## 2015-10-20 ENCOUNTER — Encounter (HOSPITAL_BASED_OUTPATIENT_CLINIC_OR_DEPARTMENT_OTHER): Payer: Self-pay | Admitting: Orthopaedic Surgery

## 2016-11-09 IMAGING — DX DG FOOT COMPLETE 3+V*L*
3 series · 3 of 3 positions shown · non-contrast
Comparison: None.

CLINICAL DATA: Marble tile dropped on left foot. Pain and swelling,
initial encounter.

EXAM:
LEFT FOOT - COMPLETE 3+ VIEW

[foot ap]
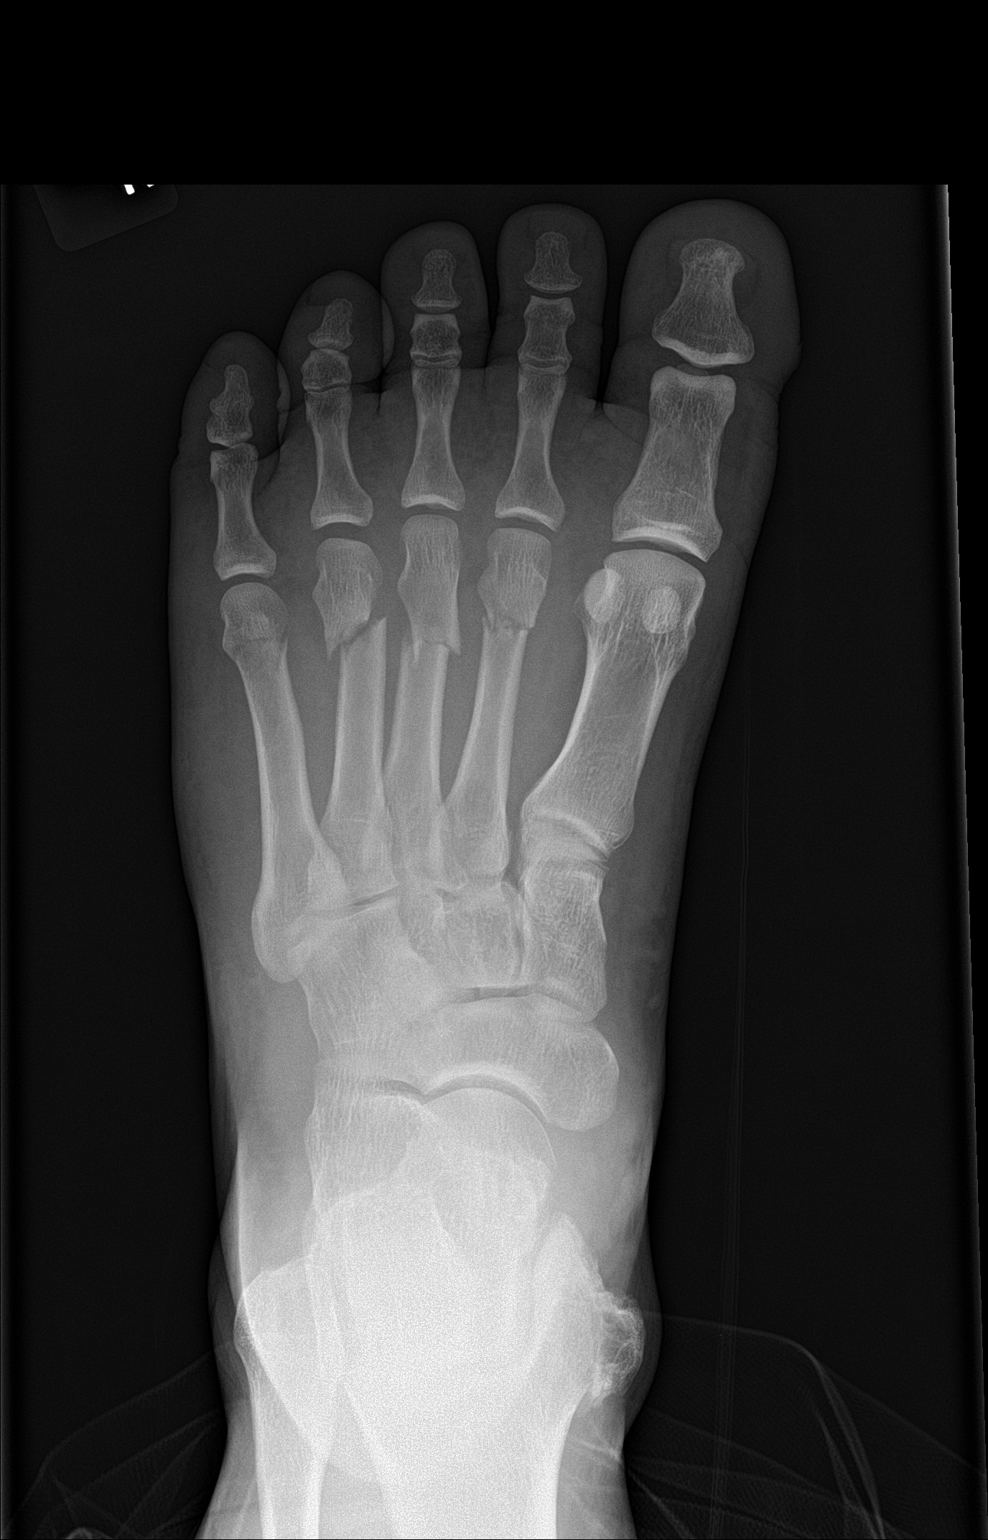

[foot obl]
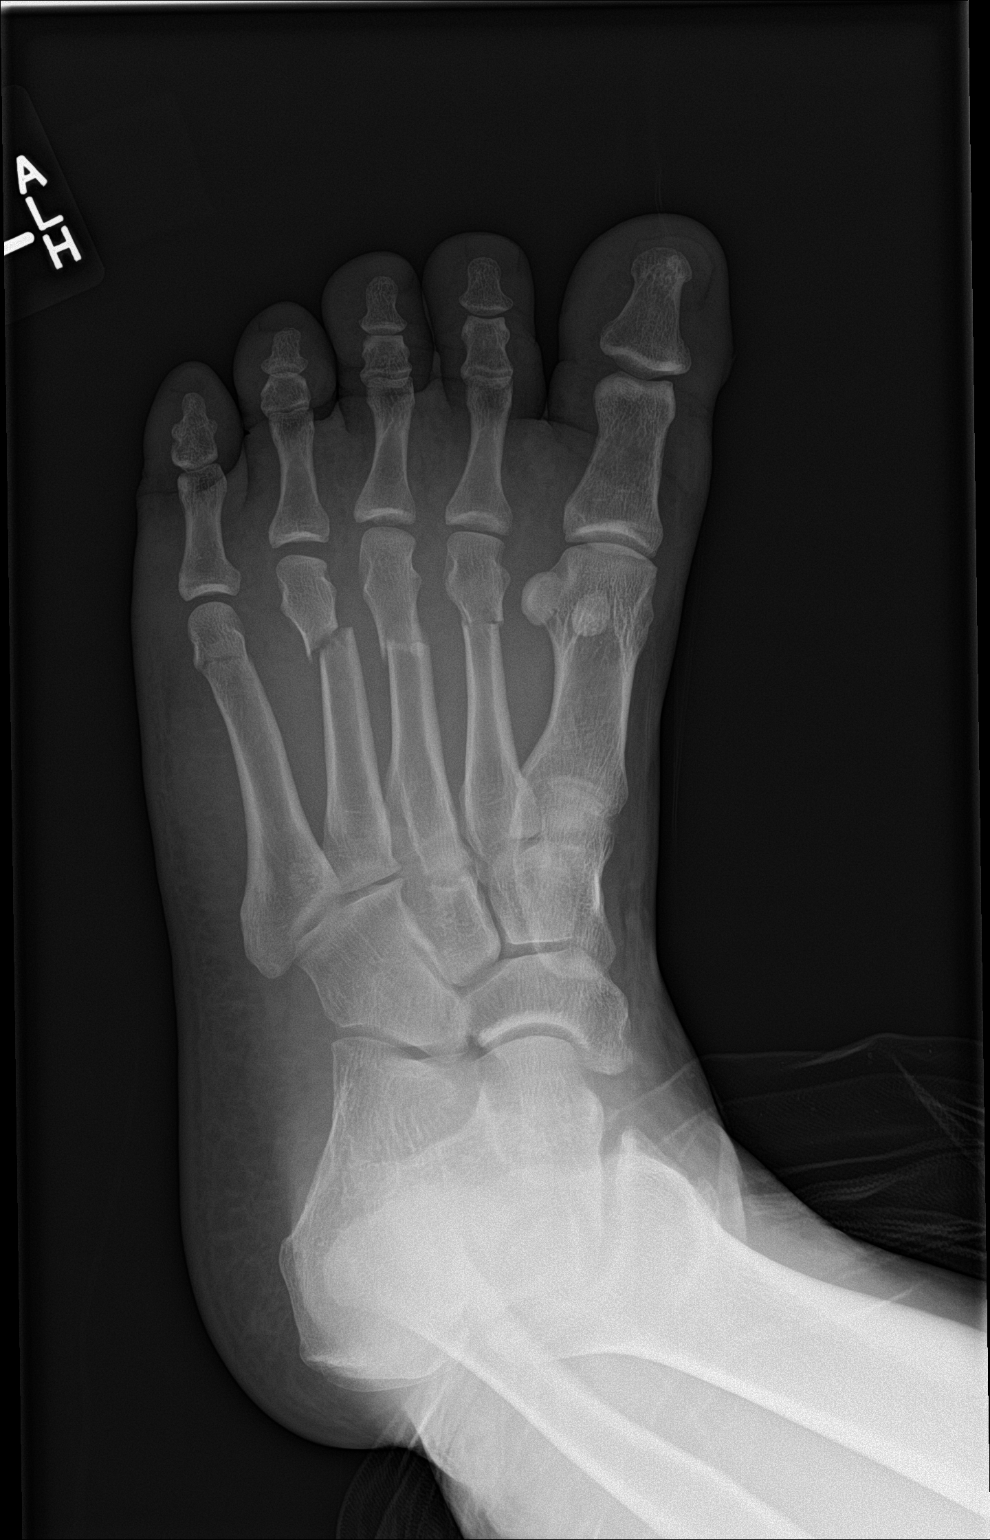

[foot lat]
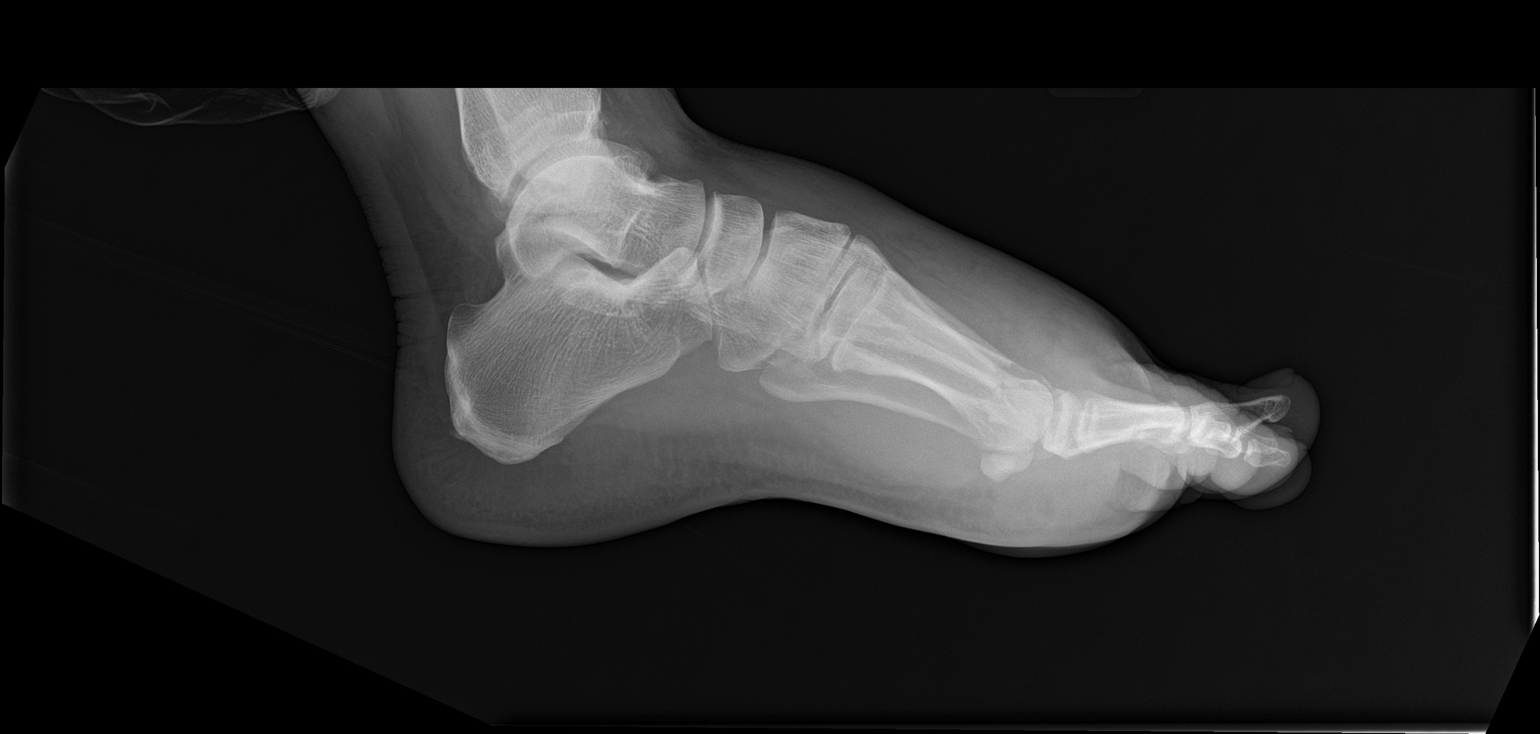

[3 of 3 positions shown; findings below may reference images not displayed]

FINDINGS: Transverse fractures of the distal shafts of the second, third and
fourth metatarsals are seen. There is approximately one cortex width
medial displacement of the distal fracture fragment involving the
third metatarsal, and 3 mm lateral displacement of the distal
fracture fragment of the fourth metatarsal. A transverse fracture of
the fifth metatarsal neck is also seen. Fracture lines do not extend
to the metatarsophalangeal joints. Associated marked soft tissue
swelling along the forefoot.
IMPRESSION: Second through fifth metatarsal fractures.
